# Patient Record
Sex: Female | Born: 2000 | Race: White | Hispanic: Yes | Marital: Single | Smoking: Never smoker
Health system: Southern US, Community
[De-identification: ages and names within clinical notes are randomized; demographics above are authoritative.]

## PROBLEM LIST (undated history)

## (undated) DIAGNOSIS — F840 Autistic disorder: Secondary | ICD-10-CM

---
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
## 2022-01-25 ENCOUNTER — Ambulatory Visit (INDEPENDENT_AMBULATORY_CARE_PROVIDER_SITE_OTHER): Payer: Self-pay | Admitting: Psychiatry

## 2022-01-25 DIAGNOSIS — Z91199 Patient's noncompliance with other medical treatment and regimen due to unspecified reason: Secondary | ICD-10-CM

## 2022-01-25 NOTE — Progress Notes (Deleted)
Psychotherapy Progress Note [facility] [name], PhD LP  Patient ID: Elizabeth Sandoval [facility])    MRN: [id number] Therapy format: {Therapy Types:21967::"Individual psychotherapy"} Date: 01/25/2022      Start: ***:***     Stop: ***:***     Time Spent: *** min Location: {SvcLoc:22530::"In-person"}   Session narrative (presenting needs, interim history, self-report of stressors and symptoms, applications of prior therapy, status changes, and interventions made in session) ***  Therapeutic modalities: {AM:23362::"Cognitive Behavioral Therapy","Solution-Oriented/Positive Psychology"}  Mental Status/Observations:  Appearance:   {PSY:22683}     Behavior:  {PSY:21022743}  Motor:  {PSY:22302}  Speech/Language:   {PSY:22685}  Affect:  {PSY:22687}  Mood:  {PSY:31886}  Thought process:  {PSY:31888}  Thought content:    {PSY:443 154 5864}  Sensory/Perceptual disturbances:    {PSY:318-146-2521}  Orientation:  {Psych Orientation:23301::"Fully oriented"}  Attention:  {Good-Fair-Poor ratings:23770::"Good"}    Concentration:  {Good-Fair-Poor ratings:23770::"Good"}  Memory:  {PSY:[phone]}  Insight:    {Good-Fair-Poor ratings:23770::"Good"}  Judgment:   {Good-Fair-Poor ratings:23770::"Good"}  Impulse Control:  {Good-Fair-Poor ratings:23770::"Good"}   Risk Assessment: Danger to Self: {Risk:22599::"No"} Self-injurious Behavior: {Risk:22599::"No"} Danger to Others: {Risk:22599::"No"} Physical Aggression / Violence: {Risk:22599::"No"} Duty to Warn: {AMYesNo:22526::"No"} Access to Firearms a concern: {AMYesNo:22526::"No"}  Assessment of progress:  {Progress:22147::"progressing"}  Diagnosis: No diagnosis found. Plan:  *** Other recommendations/advice as may be noted above Continue to utilize previously learned skills ad lib Maintain medication as prescribed and work faithfully with relevant prescriber(s) if any changes are desired or seem indicated Call the clinic  on-call service, 988/hotline, 911, or present to [facility] or ER if any life-threatening psychiatric crisis No follow-ups on file. Already scheduled visit in this office 02/22/2022.  [name], PhD [name], PhD LP Clinical Psychologist, [facility] Group [facility] [address] [place], [place] [address] [phone]

## 2022-01-25 NOTE — Progress Notes (Signed)
Admin note for non-service contact  Patient ID: Elizabeth Sandoval  MRN: [id number] DATE: 01/25/2022  No show for 3pm session, with prior history noted of NS/SNCA 5 of the 11 scheduled appts in the past 12 months.  As Pt is not yet competent to manage scheduling or provide her own transportation, requires mother Elizabeth Sandoval to be on top of it.  Charge normally.  [name], PhD [name], PhD LP Clinical Psychologist, [facility] Group [facility] [address], [place], [place] [address] [phone]

## 2022-02-22 ENCOUNTER — Ambulatory Visit (INDEPENDENT_AMBULATORY_CARE_PROVIDER_SITE_OTHER): Payer: Self-pay | Admitting: Psychiatry

## 2022-02-22 DIAGNOSIS — Z91199 Patient's noncompliance with other medical treatment and regimen due to unspecified reason: Secondary | ICD-10-CM

## 2022-02-22 NOTE — Progress Notes (Signed)
Admin note for non-service contact  Patient ID: Elizabeth Sandoval  MRN: [id number] DATE: 02/22/2022  No show for scheduled 3pm appt.  Same thing September 19.  Now 7 of 13 appts noshowed in the past year and change.  Last seen August.  Cognitively and materially, depends on mother, [name], to manage this.  No further appts are scheduled.  Will request mark the case for mother to discuss with [place] before reinstating schedule.  [name], PhD [name], PhD LP Clinical Psychologist, [facility] Group [facility] [address], [place], [place] [address] [phone]

## 2022-12-02 ENCOUNTER — Ambulatory Visit (INDEPENDENT_AMBULATORY_CARE_PROVIDER_SITE_OTHER): Payer: Federal, State, Local not specified - PPO | Admitting: Psychiatry

## 2022-12-02 DIAGNOSIS — F900 Attention-deficit hyperactivity disorder, predominantly inattentive type: Secondary | ICD-10-CM | POA: Diagnosis not present

## 2022-12-02 DIAGNOSIS — F840 Autistic disorder: Secondary | ICD-10-CM

## 2022-12-02 DIAGNOSIS — F411 Generalized anxiety disorder: Secondary | ICD-10-CM | POA: Diagnosis not present

## 2022-12-02 DIAGNOSIS — R69 Illness, unspecified: Secondary | ICD-10-CM
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
## 2023-01-16 ENCOUNTER — Ambulatory Visit: Payer: Federal, State, Local not specified - PPO | Admitting: Psychiatry

## 2023-01-16 DIAGNOSIS — F840 Autistic disorder: Secondary | ICD-10-CM | POA: Diagnosis not present

## 2023-01-16 DIAGNOSIS — F900 Attention-deficit hyperactivity disorder, predominantly inattentive type: Secondary | ICD-10-CM

## 2023-01-16 DIAGNOSIS — R69 Illness, unspecified: Secondary | ICD-10-CM

## 2023-01-16 DIAGNOSIS — F411 Generalized anxiety disorder: Secondary | ICD-10-CM | POA: Diagnosis not present
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
## 2023-02-06 ENCOUNTER — Ambulatory Visit (INDEPENDENT_AMBULATORY_CARE_PROVIDER_SITE_OTHER): Payer: Federal, State, Local not specified - PPO | Admitting: Psychiatry

## 2023-02-06 DIAGNOSIS — F840 Autistic disorder: Secondary | ICD-10-CM | POA: Diagnosis not present

## 2023-02-06 DIAGNOSIS — F900 Attention-deficit hyperactivity disorder, predominantly inattentive type: Secondary | ICD-10-CM

## 2023-02-06 DIAGNOSIS — F411 Generalized anxiety disorder: Secondary | ICD-10-CM | POA: Diagnosis not present

## 2023-02-06 DIAGNOSIS — R69 Illness, unspecified: Secondary | ICD-10-CM | POA: Diagnosis not present

## 2023-02-06 NOTE — Progress Notes (Signed)
Psychotherapy Progress Note [facility] [name], PhD LP  Patient ID: Elizabeth Sandoval [facility])    MRN: [id number] Therapy format: Individual psychotherapy Date: 02/06/2023      Start: 1:07p     Stop: 1:57p     Time Spent: 50 min Location: In-person   Session narrative (presenting needs, interim history, self-report of stressors and symptoms, applications of prior therapy, status changes, and interventions made in session) Presenting as "Lloyd Huger" today, another character drawn from media.  As she frequently does, states matters as if they were already known -- "We've been preparing, obviously, for college."  [employer], looking to getting to game design, strictly taking online classes. Lightly challenged preference for home bound, online.  Planning on doing a Engineer, site of [employer], a Surveyor, quantity.  Awaiting an email from [facility], says mother has started the process.  Has manicured nails done by self, and hoping "my sibling" can get some design practice later on them.    Says "we've been putting our foot down" with intrusive people lately -- an online "mother" that [name] or Iono (who used to "front") thought of as a mother.  Has lost some online friends lately to setting limits, after "fake claiming" and other alleged infractions.  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  Explains her system as largely composed of "fictives", drawn from media, largely Pokemon derivatives, with some "introjects", personalities drawn from actual people.  Preferring the company of other systems, rather than "singlets".  Takes issue with "endos", who claim that system can be made without trauma and can travel to other systems.  Anathema to her, as anathema as sharing bodies would be.  Still claims to have a "low split tolerance", and now claims a total of 234 identities, including fragments, a situation which requires internal "archivists", including 2 actively working  right now.  Lately probed memory between identity states, and she seems to have a more or less continuous memory, albeit fragmented at times by ADHD and inconsistent focus.  Queried about needs around anxiety -- [name] was struggling to let go of childhood.  Notes it happens sometime to turn mute.  Acknowledges has been doing some more exposure therapy for Cyn, a fictive (from "[place]" show) that bothered.  Admittedly scared to lose some family on "the dad's" side, including pGF with dementia.  Oriented to more accurate language, calling that anxiety anticipatory grief.   Has belief that people are reborn in another body.  Specific trigger for bad memory with the Halloween store, taken by father, overstimulated.    Probed self-soothing skills -- likes to go to VR "worlds" that are beautiful, peaceful.  If VR is not available, listen to songs, like "Bathed in Sound".  If audio not available, either, then breathing, or an object to hold onto.  If uncertain about things happening, will use tarot cards, finds it centers her.  Continue to encourage calming skills over dissociating, or musing about dissociating and identities, or otherwise intellectualizing her experience this way.  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  As it does not seem to have any ulterior motive or social manipulation value, will most likely allow her to go unchallenged, unless her until further coping skills or more focal need to disabuse her of  the notion.  It does seem like a well-managed fantasy life without distress leading to self-harm temptations stands to be a better adjustment than leading her to feel invalidated or "fake claimed" by a trusted professional.  Therapeutic modalities: Cognitive Behavioral Therapy,  Solution-Oriented/Positive Psychology, and Ego-Supportive  Mental Status/Observations:  Appearance:   Casual and headphones half moved  (typical)  Behavior:  Appropriate and Monopolizing  Motor:  Normal  Speech/Language:   Clear and Coherent and somewhat rapid  Affect:  Constricted  Mood:  normal  Thought process:  Mild flight  Thought content:    Ilusions  Sensory/Perceptual disturbances:    WNL  Orientation:  Fully oriented  Attention:  Good    Concentration:  Fair  Memory:  WNL  Insight:    Variable  Judgment:   Good  Impulse Control:  Variable   Risk Assessment: Danger to Self: No Self-injurious Behavior: No Danger to Others: No Physical Aggression / Violence: No Duty to Warn: No Access to Firearms a concern: No  Assessment of progress:  progressing  Diagnosis:   ICD-10-CM   1. Generalized anxiety disorder  F41.1     2. Attention deficit hyperactivity disorder (ADHD), predominantly inattentive type  F90.0     3. Autism spectrum disorder  F84.0     4. r/o OSDD vs Schizotypal Personality vs. fantasized identity  R69      Plan:  Anxiety coping skills -- Continue to use thought-stopping, redirection, and breathing skill ad lib.  Practice box breathing or similar technique for more reliable use calming.  Continue to practice exposure to images that have frightened  Dissociative identity beliefs -- Presently no challenge, in the service of more functional behavior and letting the fantasy play out, as memory is not dissociated, nor any evidence of stressful switching.  Prioritize anxiety coping, awareness of body and environment, and embarking on higher education. Life skills -- Continue learning how to use financial means, keep an account, pick up cooking skills, work up to getting driver's license.  Option to research pros/cons of different ways of living on her own as an adult.  Option to brainstorm what costs an adult pays for living by herself Educational and  developmental outlook -- Work through admission and beginning of community college.  Ensure connection with the office of special student services. Other recommendations/advice -- As may be noted above.  Continue to utilize previously learned skills ad lib. Medication compliance -- Maintain medication as prescribed and work faithfully with relevant prescriber(s) if any changes are desired or seem indicated. Crisis service -- Aware of call list and work-in appts.  Call the clinic on-call service, 988/hotline, 911, or present to [facility] or ER if any life-threatening psychiatric crisis. Followup -- Return for time as already scheduled.  Next scheduled visit with me 03/01/2023.  Next scheduled in this office 03/01/2023.  Robley Fries, PhD [name], PhD LP Clinical Psychologist, [facility] [address], [address] [place], [place] [address] [phone]

## 2023-03-01 ENCOUNTER — Ambulatory Visit (INDEPENDENT_AMBULATORY_CARE_PROVIDER_SITE_OTHER): Payer: Federal, State, Local not specified - PPO | Admitting: Psychiatry

## 2023-03-01 DIAGNOSIS — R69 Illness, unspecified: Secondary | ICD-10-CM

## 2023-03-01 DIAGNOSIS — F840 Autistic disorder: Secondary | ICD-10-CM

## 2023-03-01 DIAGNOSIS — F411 Generalized anxiety disorder: Secondary | ICD-10-CM | POA: Diagnosis not present

## 2023-03-01 DIAGNOSIS — F900 Attention-deficit hyperactivity disorder, predominantly inattentive type: Secondary | ICD-10-CM

## 2023-03-01 NOTE — Progress Notes (Signed)
 Psychotherapy Progress Note [facility] Delora Ferry, PhD LP  Patient ID: Elizabeth Sandoval [facility] "Elizabeth Sandoval    MRN: [id number] Therapy format: Individual psychotherapy Date: 03/01/2023      Start: 10:10a     Stop: 10:58a     Time Spent: 48 min Location: In-person   Session narrative (presenting needs, interim history, self-report of stressors and symptoms, applications of prior therapy, status changes, and interventions made in session) Presenting as "Elizabeth Sandoval" today, still identifies plural, with general access to others' memories but referring to "others" who experienced events (like a [employer] trip) and speaking mostly in 1st person plural.  Reviewed family environment -- sibling (sister) Elizabeth Sandoval, Elizabeth Sandoval, and a dog and a cat.  No stresses or conflicts with people living there regularly, just some incidents with the angry, drinking uncle that activated alters to protect the host.  Reaffirms plans to go to [employer], focus in Social worker, starting January.  Also wants to use it to prepare for college.  Recognizes being stuck (the host) in teenage mentality, scared to grow up, but alters are helping work through.  Explored academic Interests, which include [place] history, Greek myth.  Reluctant about math, always been difficult.  Assumes she'll be able to do entirely online, but could be interested in in-person classes.  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  Probed awareness of the process getting connected to school services and encouraged to make 2 calls to (1) ask admissions what else is needed and (2) ask to be connected to the special students' office.  Also should verify course registration and schedule, preferably sooner, like this month, rather than later, like after Thanksgiving.    Confirms Elizabeth Sandoval still does not know she's plural, says she tried to bring it up earlier and Elizabeth Sandoval denied it.   Challenged to at least decide on a "collective name" for the internal group, if not using her given name, Elizabeth Sandoval.  Suggested she/they explain that it's uncomfortable, and it would help settle tensions to go by a new name, but still try to accept the world's need to go by one name typically, and not insist that others learn a whole set of alter names in order to hold down interpersonal stress.  One suggestion might be "Elizabeth Sandoval" which incorporates part of her given name, or "Elizabeth Sandoval", which she/they like best.  Discovered after departure that Pt is not scheduled, again, and thus will require her/they and Elizabeth Sandoval organize further, and most likely will have to put up with long wait times amid crowded caseload.  Therapeutic modalities: Cognitive Behavioral Therapy, Solution-Oriented/Positive Psychology, and Ego-Supportive  Mental Status/Observations:  Appearance:   Casual     Behavior:  Monopolizing and somewhat hyperactive as usual  Motor:  Normal  Speech/Language:   Clear and Coherent  Affect:  Appropriate  Mood:  normal  Thought process:  Some flight  Thought content:    fantastical  Sensory/Perceptual disturbances:    WNL  Orientation:  grossly intact  Attention:  Fair    Concentration:  Good  Memory:  grossly intact  Insight:    Fair  Judgment:   Good  Impulse Control:  Fair   Risk Assessment: Danger to Self: No Self-injurious Behavior: No Danger to Others: No Physical Aggression / Violence: No Duty to Warn: No Access to Firearms a concern: No  Assessment of progress:  stabilized  Diagnosis:   ICD-10-CM   1. Generalized anxiety disorder  F41.1     2. Attention deficit hyperactivity disorder (ADHD), predominantly inattentive type  F90.0     3. Autism spectrum disorder  F84.0     4. r/o OSDD vs Schizotypal Personality vs. fantasized identity  R69      Plan:  Dissociative identity ("plural") beliefs -- Presently no challenge, in the service of more functional behavior and letting the  fantasy play out, as memory is not dissociated nor any evidence of stressful or dysfunctional switching.  Present strategy to support sense of community among perceived alters, emphasize working together for common good and try to settle with her social world on a single name all can answer to, like "Elizabeth Sandoval".  Otherwise, prioritize anxiety coping, awareness of body and environment, and embarking on higher education. Anxiety coping skills -- Continue to use thought-stopping, redirection, and breathing skill ad lib.  Practice box breathing or similar technique for more reliable use calming.  Continue to practice exposure to images that have frightened. Life skills -- Continue learning how to use financial means, keep an account, pick up cooking skills, work up to getting driver's license.  Option to research pros/cons of different ways of living on her own as an adult.  Option to brainstorm what costs an adult pays for living by themselves. Educational and developmental outlook -- Work through admission and beginning of community college.  Ensure connection with the office of special student services. Other recommendations/advice -- As may be noted above.  Continue to utilize previously learned skills ad lib. Medication compliance -- Maintain medication as prescribed and work faithfully with relevant prescriber(s) if any changes are desired or seem indicated. Crisis service -- Aware of call list and work-in appts.  Call the clinic on-call service, 988/hotline, 911, or present to [facility] or ER if any life-threatening psychiatric crisis. Followup -- Return for time as available, put on [place] list.  Next scheduled visit with me Visit date not found.  Next scheduled in this office Visit date not found.  Maretta Shaper, PhD Delora Ferry, PhD LP Clinical Psychologist, [facility] [address], [address] [place], [place] [address] [phone]

## 2023-04-26 ENCOUNTER — Ambulatory Visit (INDEPENDENT_AMBULATORY_CARE_PROVIDER_SITE_OTHER): Payer: Federal, State, Local not specified - PPO | Admitting: Psychiatry

## 2023-04-26 DIAGNOSIS — F411 Generalized anxiety disorder: Secondary | ICD-10-CM

## 2023-04-26 DIAGNOSIS — F900 Attention-deficit hyperactivity disorder, predominantly inattentive type: Secondary | ICD-10-CM | POA: Diagnosis not present

## 2023-04-26 DIAGNOSIS — F840 Autistic disorder: Secondary | ICD-10-CM | POA: Diagnosis not present

## 2023-04-26 DIAGNOSIS — R69 Illness, unspecified: Secondary | ICD-10-CM

## 2023-04-26 NOTE — Progress Notes (Signed)
 Psychotherapy Progress Note [facility] Delora Ferry, PhD LP  Patient ID: Elizabeth Sandoval [facility] "Elizabeth Sandoval    MRN: [id number] Therapy format: Individual psychotherapy Date: 04/26/2023      Start: 1:08p     Stop: 1:56p     Time Spent: 48 min Location: In-person   Session narrative (presenting needs, interim history, self-report of stressors and symptoms, applications of prior therapy, status changes, and interventions made in session) 2 months since last seen.  Chatty, energetic to begin with.  "[name]" is the presenting identity today, but still likes the idea of going by "Elizabeth Sandoval" for the collective, just has not revealed that yet to family.  Encouraged to broach it despite reluctance, as it will advance the cause of being accepted, at least if it helps compromise between a rigid insistence on being taken as a multi-named person and the community's need to identify a unitary person.  Paternal grandfather passed away 20-Apr-2024, did not attend funeral given other conditions.  Switches to interests -- looking to do some more "voice work", singing, karaoke, maybe making recordings.  Spontaneously demonstrates her singing voice in session, and she is objectively talented, from the sound of it.  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  Some rambling about artists she likes, etc.  States she/they are in a relationship (online), designated "polyamorous". (Unexplained, but clearly not sexually engaged, seems to mean dating, non-exclusive, but apparently the term has a certain cachet.)  Intended activities are merely to watch movies online together, esp childhood favorites like "Polar Express".  Interested in [employer] coming out.  Reaffirmed awareness and judgment about online solicitation and shutting it down if it happens.  Very clear since her prior experiences.  Glad to be certified 18+  now in the age verification system, won't have to deal as much with pesky minors in online forums.  Says she/they have dealt with some stress in online life, and that one alter got traumatized and split further, but says the internal system is operating in good harmony overall.  Affirmed and encouraged.  New alters are referred to as "characters" today, again noting how new ones almost exclusively come from media.  Observed from time to time using first person singular pronouns, a good sign for integration and/or turning loose of the fantasy of being plural.  Says internal system contains persons who provide food, administer first aid and medical care, and provide care for others.  Says her/their "headspace" has extensive facilities inside, like a health care clinic, effectively taking the idea of a "mind castle" and expanding it to something more like a mind "town".    "Headmates" have been coaxing her to be more authentic about how she feels, don't have to put on a front for people.  Affirmed and encouraged in that, as well, reiterating the value of a single name to be known by the world outside herself.  Xmas plans include watching Polar Express, as noted, and introducing a little cousin to things she enjoyed as a child at [employer].    Therapeutic modalities: Cognitive Behavioral Therapy, Solution-Oriented/Positive Psychology, and Ego-Supportive  Mental Status/Observations:  Appearance:   Casual     Behavior:  Appropriate  Motor:  Normal  Speech/Language:   Clear and Coherent and some pressure  Affect:  Appropriate  Mood:  normal and excitable  Thought process:  Mild flight  Thought content:  fantastical  Sensory/Perceptual disturbances:    WNL  Orientation:  grossly intact  Attention:  Fair    Concentration:  Fair  Memory:  grossly intact  Insight:    Fair  Judgment:   Good  Impulse Control:  Fair   Risk Assessment: Danger to Self: No Self-injurious Behavior: No Danger to  Others: No Physical Aggression / Violence: No Duty to Warn: No Access to Firearms a concern: No  Assessment of progress:  stabilized  Diagnosis:   ICD-10-CM   1. Generalized anxiety disorder r/o h/o PTSD  F41.1    Hx family anger and online solicitation    2. Attention deficit hyperactivity disorder (ADHD), predominantly inattentive type  F90.0     3. Autism spectrum disorder  F84.0     4. r/o OSDD vs Schizotypal Personality vs. fantasized identity  R69      Plan:  Dissociative identity ("plural") beliefs -- Presently no challenge, in the service of more functional behavior and letting the fantasy play out, as memory is not dissociated nor any evidence of stressful or dysfunctional switching, and her use of 1st person singular pronouns and terms like "headmates" really suggests a more fundamental singular orientation and ideas of being "plural" are just that -- ideas to play with.  Present strategy is to support sense of community among perceived alters, emphasize working together for common good, and using an adjusted name to go by (e.g., "Ari") as a Print production planner for clearer individual identity, a means of validating the struggle to individuate, and settle with her social world's need to go by a single name.  Otherwise, prioritize anxiety coping, awareness of body and environment, and embarking on higher education or other development and involvement in the world more than online. Anxiety coping skills -- Continue to use thought-stopping, redirection, and breathing skill ad lib.  Practice box breathing or similar technique for more reliable use calming.  Continue to practice exposure to images that have frightened. Life skills -- Continue learning how to use financial means, keep an account, pick up cooking skills, work up to getting driver's license.  Option to research pros/cons of different ways of living on her own as an adult.  Option to brainstorm what costs an adult pays for living by  themselves. Educational and developmental outlook -- Work through admission and beginning of community college.  Ensure connection with the office of special student services. Other recommendations/advice -- As may be noted above.  Continue to utilize previously learned skills ad lib. Medication compliance -- Maintain medication as prescribed and work faithfully with relevant prescriber(s) if any changes are desired or seem indicated. Crisis service -- Aware of call list and work-in appts.  Call the clinic on-call service, 988/hotline, 911, or present to [facility] or ER if any life-threatening psychiatric crisis. Followup -- Return for time as already scheduled.  Next scheduled visit with me 05/17/2023.  Next scheduled in this office 05/17/2023.  Addendum: No showed for subsequent sessions 1/8 and 2/4 and remains unscheduled.  ED note from 3/30 for somewhat ill-defined anxiety and transient tics indicates no medication for ADHD (which may be appropriate, given the prominence of anxiety, PTSD-like history, and ASD), referral to [facility] for emergent needs, if any, and mother's stated plan to have her evaluated for Tourette's, for unclear reasons.  Impression remains that details of Ari's inner life are poorly shared, and mother poorly informed about her condition, setting up for a longer time of inconsistently pursuing her mental health and development, and a  longer time unclear about how to progress to adult independence.  Future work would call for consent to involve her mother more clearly in treatment and provide guidance in parenting a neurodivergent adult child.  Maretta Shaper, PhD Delora Ferry, PhD LP Clinical Psychologist, [facility] [address] [place], [place] [address] [phone]

## 2023-05-17 ENCOUNTER — Ambulatory Visit (INDEPENDENT_AMBULATORY_CARE_PROVIDER_SITE_OTHER): Payer: Self-pay | Admitting: Psychiatry

## 2023-05-17 DIAGNOSIS — Z91199 Patient's noncompliance with other medical treatment and regimen due to unspecified reason: Secondary | ICD-10-CM

## 2023-05-30 NOTE — Progress Notes (Signed)
Admin note for non-service contact  Patient ID: Elizabeth Sandoval  MRN: [id number] DATE: 05/17/2023  No show for appt, which has happened several times before.  No message from Pt or mother, who normally drives her.  Regular charge.  RS as able/interested.  Robley Fries, PhD [name], PhD LP Clinical Psychologist, [facility] [address] [place], [place] [address] [phone]

## 2023-06-13 ENCOUNTER — Ambulatory Visit: Payer: Federal, State, Local not specified - PPO | Admitting: Psychiatry

## 2023-06-13 DIAGNOSIS — Z91199 Patient's noncompliance with other medical treatment and regimen due to unspecified reason: Secondary | ICD-10-CM

## 2023-06-13 NOTE — Progress Notes (Signed)
 Admin note for non-service contact  Patient ID: Elizabeth Sandoval  MRN: [id number] DATE: 06/13/2023  Noshowed for second appointment running.  Will suspend further scheduling and inquire about her intentions and capacity to organize visits.  Most likely her mother needs to be involved, as she is functionally naive and possibly incompetent to manage services on her own and depends on her mother for much life organization.  [name], PhD [name], PhD LP Clinical Psychologist, [facility] [address], [address] [place], [place] [address] [phone]

## 2023-06-13 NOTE — Progress Notes (Deleted)
 Psychotherapy Progress Note [facility] [name], PhD LP  Patient ID: Elizabeth Sandoval [facility] [place])    MRN: [id number] Therapy format: {Therapy Types:21967::Individual psychotherapy} Date: 06/13/2023      Start: ***:***     Stop: ***:***     Time Spent: *** min Location: {SvcLoc:22530::In-person}   Session narrative (presenting needs, interim history, self-report of stressors and symptoms, applications of prior therapy, status changes, and interventions made in session) ***  Therapeutic modalities: {AM:23362::Cognitive Behavioral Therapy,Solution-Oriented/Positive Psychology}  Mental Status/Observations:  Appearance:   {PSY:22683}     Behavior:  {PSY:21022743}  Motor:  {PSY:22302}  Speech/Language:   {PSY:22685}  Affect:  {PSY:22687}  Mood:  {PSY:31886}  Thought process:  {PSY:31888}  Thought content:    {[phone]}  Sensory/Perceptual disturbances:    {PSY:6138418417}  Orientation:  {Psych Orientation:23301::Fully oriented}  Attention:  {Good-Fair-Poor ratings:23770::Good}    Concentration:  {Good-Fair-Poor ratings:23770::Good}  Memory:  {PSY:[phone]}  Insight:    {Good-Fair-Poor ratings:23770::Good}  Judgment:   {Good-Fair-Poor ratings:23770::Good}  Impulse Control:  {Good-Fair-Poor ratings:23770::Good}   Risk Assessment: Danger to Self: {Risk:22599::No} Self-injurious Behavior: {Risk:22599::No} Danger to Others: {Risk:22599::No} Physical Aggression / Violence: {Risk:22599::No} Duty to Warn: {AMYesNo:22526::No} Access to Firearms a concern: {AMYesNo:22526::No}  Assessment of progress:  {Progress:22147::progressing}  Diagnosis: No diagnosis found. Plan:  *** Other recommendations/advice -- As may be noted above.  Continue to utilize previously learned skills ad lib. Medication compliance -- Maintain medication as prescribed and work faithfully with relevant prescriber(s) if any changes are desired or  seem indicated. Crisis service -- Aware of call list and work-in appts.  Call the clinic on-call service, 988/hotline, 911, or present to [facility] or ER if any life-threatening psychiatric crisis. Followup -- No follow-ups on file.  Next scheduled visit with me Visit date not found.  Next scheduled in this office Visit date not found.  [name], PhD [name], PhD LP Clinical Psychologist, [facility] [address], [address] [place], [place] [address] [phone]

## 2023-08-06 ENCOUNTER — Emergency Department (HOSPITAL_COMMUNITY)
Admission: EM | Admit: 2023-08-06 | Discharge: 2023-08-06 | Disposition: A | Discharge status: Home / Self Care | Payer: Self-pay | Attending: Emergency Medicine | Admitting: Emergency Medicine

## 2023-08-06 ENCOUNTER — Encounter (HOSPITAL_COMMUNITY): Payer: Self-pay

## 2023-08-06 ENCOUNTER — Other Ambulatory Visit: Payer: Self-pay

## 2023-08-06 DIAGNOSIS — F419 Anxiety disorder, unspecified: Secondary | ICD-10-CM | POA: Diagnosis present

## 2023-08-06 DIAGNOSIS — F950 Transient tic disorder: Secondary | ICD-10-CM | POA: Insufficient documentation

## 2023-08-06 HISTORY — DX: Autistic disorder: F84.0

## 2023-08-06 NOTE — Discharge Instructions (Signed)
 Please follow up with your primary care team for further evaluation of your tics. I have provided information on the behavioral health urgent care. You may follow up there if you have further episodes of anxiety

## 2023-08-06 NOTE — ED Provider Notes (Signed)
 [place] EMERGENCY DEPARTMENT AT [facility] Provider Note   CSN: [id number] Arrival date & time: 08/06/23  0113     History  Chief Complaint  Patient presents with   Anxiety   Tics    Elizabeth Sandoval is a 23 y.o. female.  Patient with past medical history significant for ADHD, autism presents emerged department complaining of anxiety and tics.  Patient endorses verbal and motor tics which have been present since the beginning of January.  Tonight she states she had a "anxiety attack".  She is unsure if there was any trigger at home.  She does not take anxiety medications at home.  She is currently denying any further anxiety.  She denies any physical complaints at this time other than her new tics.   Anxiety       Home Medications Prior to Admission medications   Medication Sig Start Date End Date Taking? Authorizing Provider  atomoxetine (STRATTERA) 60 MG capsule Take 60 mg by mouth daily.    [provider]  brompheniramine-pseudoephedrine-DM 30-2-10 MG/5ML syrup Take 5 mLs by mouth 4 (four) times daily as needed. 10/14/14   Linna Hoff, MD  Methylphenidate (COTEMPLA XR-ODT) 25.9 MG TBED Take by mouth.    [provider]      Allergies    Patient has no known allergies.    Review of Systems   Review of Systems  Physical Exam Updated Vital Signs BP 129/71   Pulse 81   Temp 98.9 F (37.2 C) (Oral)   Resp 17   Ht 5\' 3"  (1.6 m)   SpO2 98%  Physical Exam Vitals and nursing note reviewed.  HENT:     Head: Normocephalic and atraumatic.  Eyes:     Conjunctiva/sclera: Conjunctivae normal.     Pupils: Pupils are equal, round, and reactive to light.  Cardiovascular:     Rate and Rhythm: Normal rate.  Pulmonary:     Effort: Pulmonary effort is normal. No respiratory distress.  Musculoskeletal:        General: No signs of injury.     Cervical back: Normal range of motion.  Skin:    General: Skin is dry.  Neurological:     Mental  Status: She is alert.     Comments: Patient with occasional motor tics, occasional verbal tics.  Psychiatric:        Speech: Speech normal.        Behavior: Behavior normal.     ED Results / Procedures / Treatments   Labs (all labs ordered are listed, but only abnormal results are displayed) Labs Reviewed - No data to display  EKG None  Radiology No results found.  Procedures Procedures    Medications Ordered in ED Medications - No data to display  ED Course/ Medical Decision Making/ A&P                                 Medical Decision Making  Patient presenting present with a chief complaint of anxiety.  She is currently no longer endorsing anxiety.  She is hesitant to take medications as her previous ADHD medications seem to make her feel unwell and she discontinued these once she was out of school.  Patient does endorse attacks but denies any other medical complaints at this time.  I see no indication for further emergent workup.  Patient's mother is reportedly working on an appointment to have patient  evaluated for possible Tourette's syndrome.  Will provide patient information on behavioral urgent care for follow-up if needed.        Final Clinical Impression(s) / ED Diagnoses Final diagnoses:  Anxiety  Transient tics    Rx / DC Orders ED Discharge Orders     None         Pamala Duffel 08/06/23 0251    Nira Conn, MD 08/06/23 [phone]

## 2023-08-06 NOTE — ED Triage Notes (Signed)
 Pt BIBA from home, c/o tics for the past couple months.  Per ems having panic attack when they arrived.  hx of autism and anxiety. VSS.
